# Patient Record
Sex: Male | Born: 1990 | Race: Black or African American | Hispanic: No | Marital: Single | Smoking: Current every day smoker
Health system: Southern US, Community
[De-identification: ages and names within clinical notes are randomized; demographics above are authoritative.]

---
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
## 2008-10-29 ENCOUNTER — Emergency Department: Payer: Self-pay | Admitting: Internal Medicine

## 2009-06-20 ENCOUNTER — Emergency Department: Payer: Self-pay | Admitting: Emergency Medicine

## 2009-12-23 ENCOUNTER — Emergency Department: Payer: Self-pay | Admitting: Emergency Medicine

## 2010-01-23 ENCOUNTER — Emergency Department: Payer: Self-pay | Admitting: Emergency Medicine

## 2011-04-07 ENCOUNTER — Emergency Department: Payer: Self-pay | Admitting: Emergency Medicine

## 2011-04-07 LAB — MONONUCLEOSIS SCREEN: Mono Test: NEGATIVE
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
## 2013-09-16 ENCOUNTER — Emergency Department: Payer: Self-pay | Admitting: Emergency Medicine

## 2014-01-14 IMAGING — CR DG CHEST 2V
1 series · 2 of 2 positions shown · non-contrast
Comparison: none

REASON FOR EXAM: fever cough
COMMENTS:

PROCEDURE:     DXR - DXR CHEST PA (OR AP) AND LATERAL  - April 07, 2011  [DATE]
RESULT:     The lung fields are clear. The heart, mediastinal and osseous
structures show no significant abnormalities. The chest appears mildly
hyperinflated bilaterally which suggests a history of asthma.

[Series 1: w chest pa · 0.14mm/px · 2 of 2 slices shown]
[im 1/2]
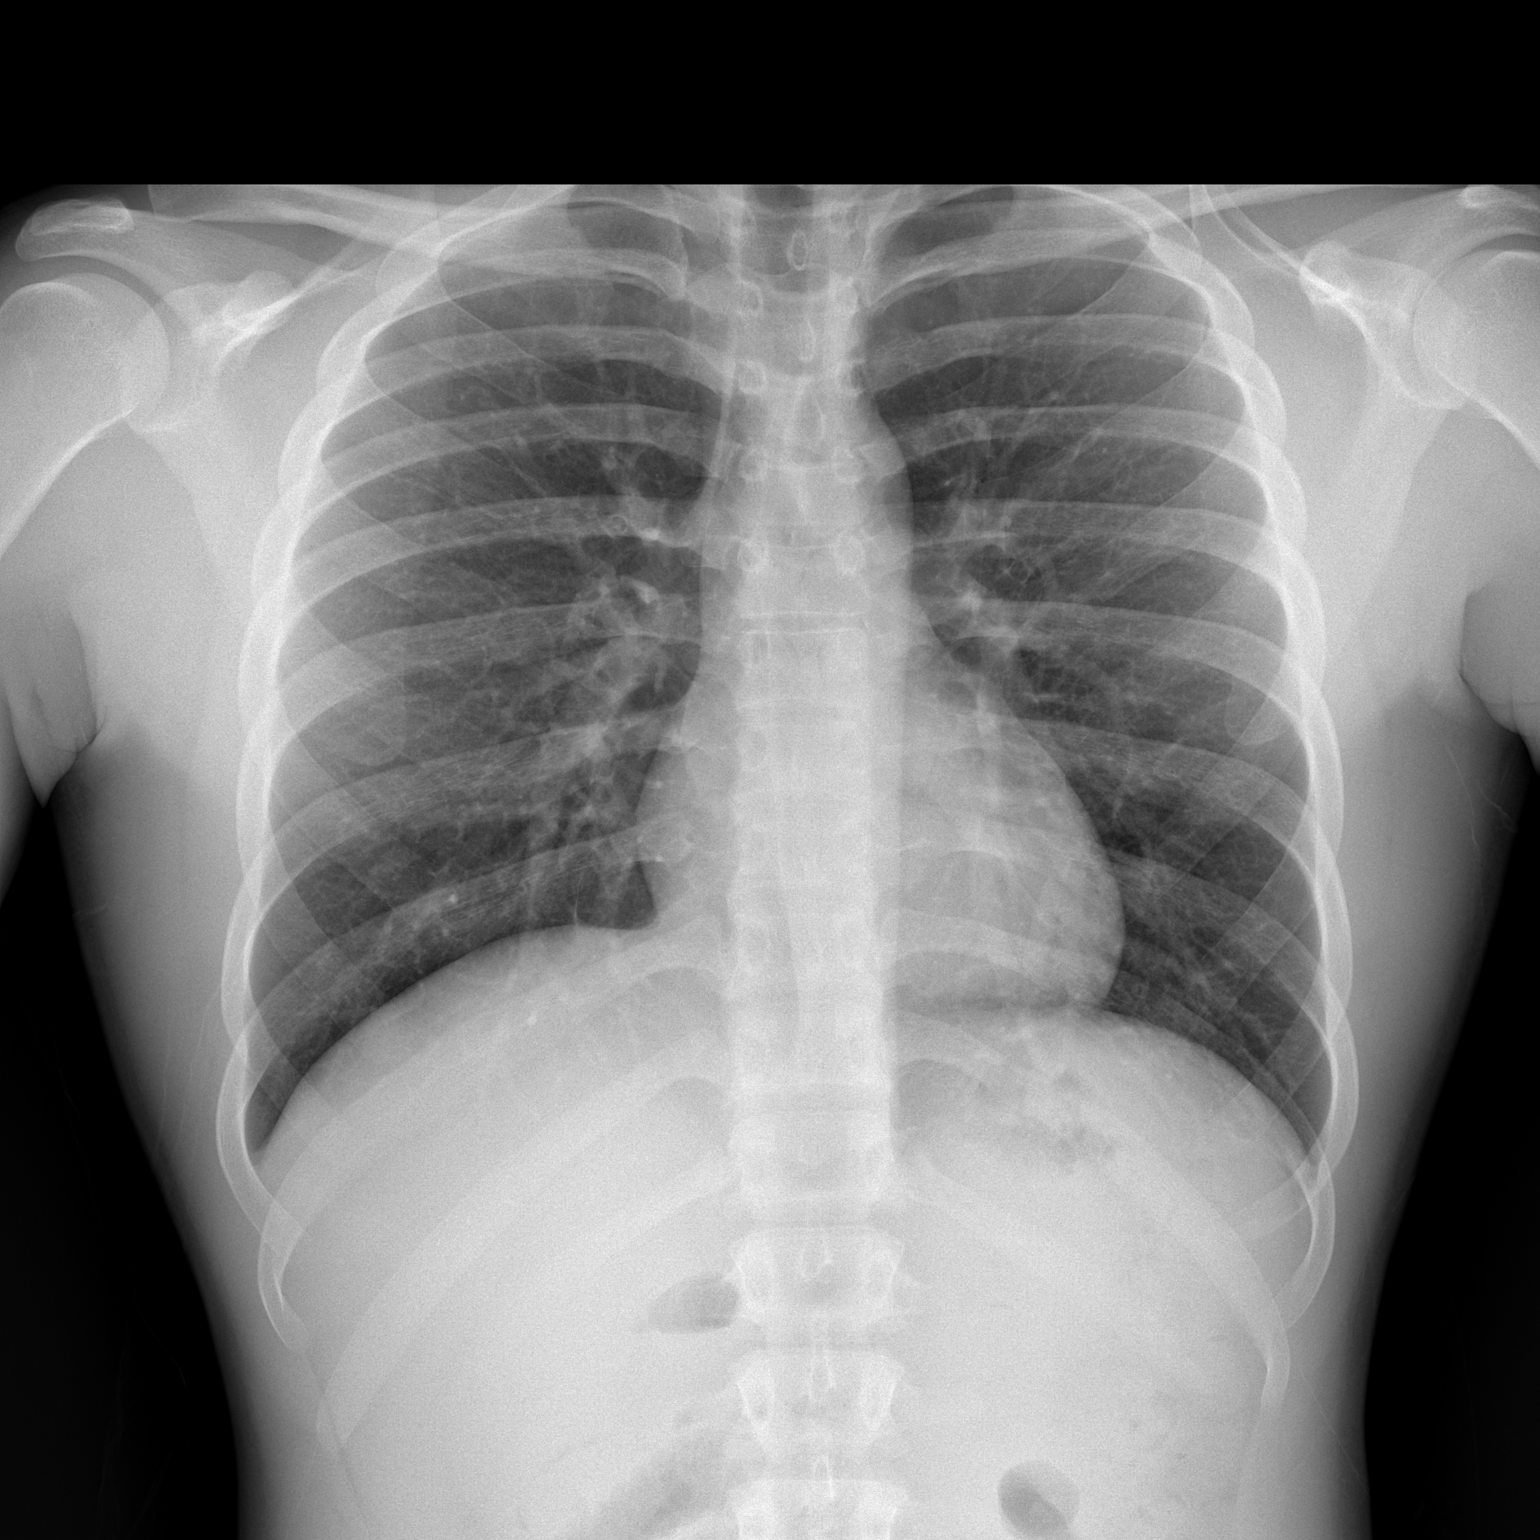
[im 2/2]
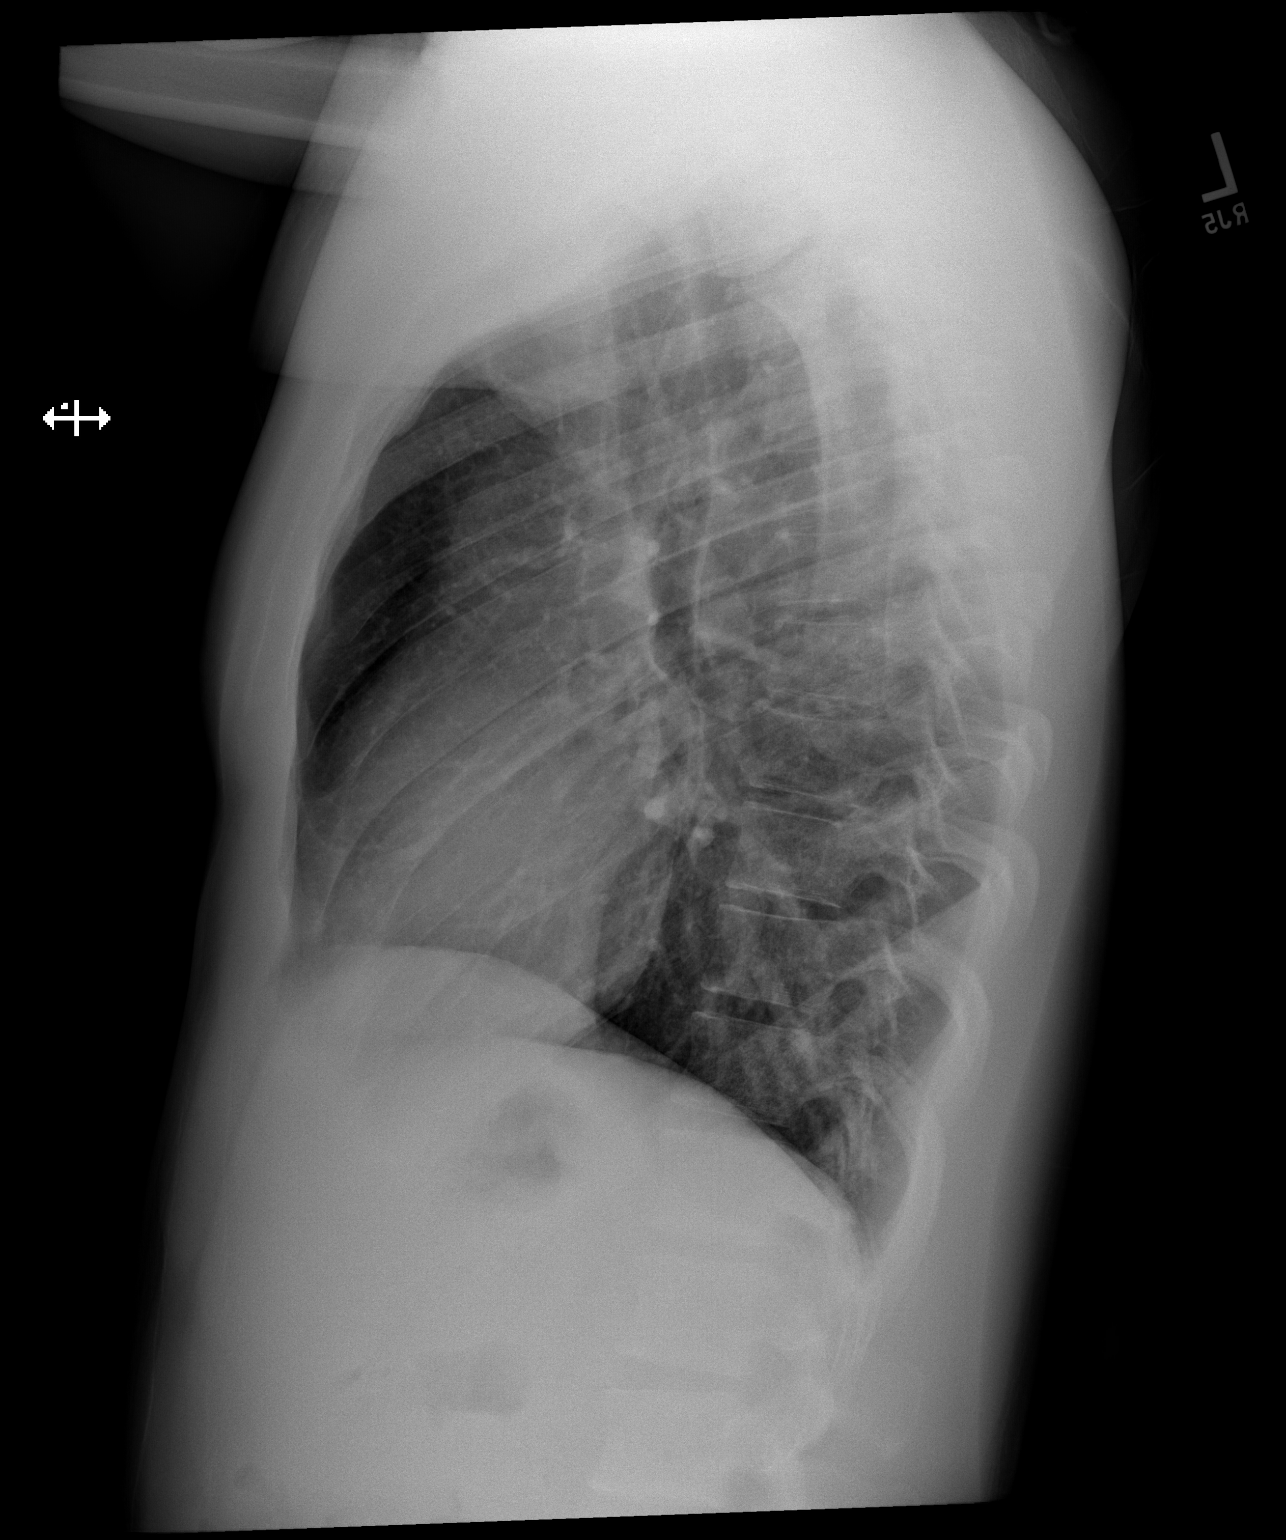

[2 of 2 positions shown; findings below may reference images not displayed]

IMPRESSION: 1. The lung fields are clear.
2. Heart size is normal.
3. The chest appears mildly hyperinflated bilaterally.

## 2014-07-26 ENCOUNTER — Encounter: Payer: Self-pay | Admitting: Urgent Care

## 2014-07-26 DIAGNOSIS — S4992XA Unspecified injury of left shoulder and upper arm, initial encounter: Secondary | ICD-10-CM | POA: Diagnosis not present

## 2014-07-26 DIAGNOSIS — Z720 Tobacco use: Secondary | ICD-10-CM | POA: Diagnosis not present

## 2014-07-26 DIAGNOSIS — Y92410 Unspecified street and highway as the place of occurrence of the external cause: Secondary | ICD-10-CM | POA: Diagnosis not present

## 2014-07-26 DIAGNOSIS — Y998 Other external cause status: Secondary | ICD-10-CM | POA: Diagnosis not present

## 2014-07-26 DIAGNOSIS — S29012A Strain of muscle and tendon of back wall of thorax, initial encounter: Secondary | ICD-10-CM | POA: Diagnosis not present

## 2014-07-26 DIAGNOSIS — Y9389 Activity, other specified: Secondary | ICD-10-CM | POA: Diagnosis not present

## 2014-07-26 DIAGNOSIS — S299XXA Unspecified injury of thorax, initial encounter: Secondary | ICD-10-CM | POA: Diagnosis present

## 2014-07-26 DIAGNOSIS — S239XXA Sprain of unspecified parts of thorax, initial encounter: Secondary | ICD-10-CM | POA: Insufficient documentation
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
## 2014-07-27 ENCOUNTER — Emergency Department
Admission: EM | Admit: 2014-07-27 | Discharge: 2014-07-27 | Disposition: A | Discharge status: Home / Self Care | Payer: No Typology Code available for payment source | Attending: Emergency Medicine | Admitting: Emergency Medicine

## 2014-07-27 ENCOUNTER — Encounter: Payer: Self-pay | Admitting: Emergency Medicine

## 2014-07-27 ENCOUNTER — Emergency Department: Payer: No Typology Code available for payment source

## 2014-07-27 DIAGNOSIS — Z720 Tobacco use: Secondary | ICD-10-CM | POA: Diagnosis not present

## 2014-07-27 DIAGNOSIS — S233XXA Sprain of ligaments of thoracic spine, initial encounter: Secondary | ICD-10-CM

## 2014-07-27 DIAGNOSIS — Z791 Long term (current) use of non-steroidal anti-inflammatories (NSAID): Secondary | ICD-10-CM | POA: Diagnosis not present

## 2014-07-27 DIAGNOSIS — S4991XD Unspecified injury of right shoulder and upper arm, subsequent encounter: Secondary | ICD-10-CM | POA: Diagnosis present

## 2014-07-27 DIAGNOSIS — S299XXD Unspecified injury of thorax, subsequent encounter: Secondary | ICD-10-CM | POA: Diagnosis not present

## 2014-07-27 DIAGNOSIS — T148 Other injury of unspecified body region: Secondary | ICD-10-CM | POA: Insufficient documentation

## 2014-07-27 DIAGNOSIS — S29012A Strain of muscle and tendon of back wall of thorax, initial encounter: Secondary | ICD-10-CM

## 2014-07-27 MED ORDER — NAPROXEN 250 MG PO TABS: 250.0000 mg | ORAL_TABLET | Freq: Two times a day (BID) | ORAL | Status: AC

## 2014-07-27 MED ORDER — DIAZEPAM 5 MG PO TABS
5.0000 mg | ORAL_TABLET | Freq: Once | ORAL | Status: AC
  Administered 2014-07-27: 5 mg via ORAL

## 2014-07-27 MED ORDER — DIAZEPAM 5 MG PO TABS: 5.0000 mg | ORAL_TABLET | Freq: Three times a day (TID) | ORAL | Status: AC | PRN

## 2014-07-27 MED ORDER — DIAZEPAM 5 MG PO TABS
ORAL_TABLET | ORAL | Status: AC
  Administered 2014-07-27: 5 mg via ORAL
  Filled 2014-07-27: qty 1

## 2014-07-27 MED ORDER — IBUPROFEN 800 MG PO TABS
800.0000 mg | ORAL_TABLET | Freq: Once | ORAL | Status: AC
  Administered 2014-07-27: 800 mg via ORAL

## 2014-07-27 MED ORDER — IBUPROFEN 800 MG PO TABS
ORAL_TABLET | ORAL | Status: AC
  Administered 2014-07-27: 800 mg via ORAL
  Filled 2014-07-27: qty 1
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
## 2014-07-27 NOTE — Discharge Instructions (Signed)
Back Exercises These exercises may help you when beginning to rehabilitate your injury. Your symptoms may resolve with or without further involvement from your physician, physical therapist or athletic trainer. While completing these exercises, remember:   Restoring tissue flexibility helps normal motion to return to the joints. This allows healthier, less painful movement and activity.  An effective stretch should be held for at least 30 seconds.  A stretch should never be painful. You should only feel a gentle lengthening or release in the stretched tissue. STRETCH - Extension, Prone on Elbows   Lie on your stomach on the floor, a bed will be too soft. Place your palms about shoulder width apart and at the height of your head.  Place your elbows under your shoulders. If this is too painful, stack pillows under your chest.  Allow your body to relax so that your hips drop lower and make contact more completely with the floor.  Hold this position for __________ seconds.  Slowly return to lying flat on the floor. Repeat __________ times. Complete this exercise __________ times per day.  RANGE OF MOTION - Extension, Prone Press Ups   Lie on your stomach on the floor, a bed will be too soft. Place your palms about shoulder width apart and at the height of your head.  Keeping your back as relaxed as possible, slowly straighten your elbows while keeping your hips on the floor. You may adjust the placement of your hands to maximize your comfort. As you gain motion, your hands will come more underneath your shoulders.  Hold this position __________ seconds.  Slowly return to lying flat on the floor. Repeat __________ times. Complete this exercise __________ times per day.  RANGE OF MOTION- Quadruped, Neutral Spine   Assume a hands and knees position on a firm surface. Keep your hands under your shoulders and your knees under your hips. You may place padding under your knees for  comfort.  Drop your head and point your tail bone toward the ground below you. This will round out your low back like an angry cat. Hold this position for __________ seconds.  Slowly lift your head and release your tail bone so that your back sags into a large arch, like an old horse.  Hold this position for __________ seconds.  Repeat this until you feel limber in your low back.  Now, find your "sweet spot." This will be the most comfortable position somewhere between the two previous positions. This is your neutral spine. Once you have found this position, tense your stomach muscles to support your low back.  Hold this position for __________ seconds. Repeat __________ times. Complete this exercise __________ times per day.  STRETCH - Flexion, Single Knee to Chest   Lie on a firm bed or floor with both legs extended in front of you.  Keeping one leg in contact with the floor, bring your opposite knee to your chest. Hold your leg in place by either grabbing behind your thigh or at your knee.  Pull until you feel a gentle stretch in your low back. Hold __________ seconds.  Slowly release your grasp and repeat the exercise with the opposite side. Repeat __________ times. Complete this exercise __________ times per day.  STRETCH - Hamstrings, Standing  Stand or sit and extend your right / left leg, placing your foot on a chair or foot stool  Keeping a slight arch in your low back and your hips straight forward.  Lead with your chest and  lean forward at the waist until you feel a gentle stretch in the back of your right / left knee or thigh. (When done correctly, this exercise requires leaning only a small distance.)  Hold this position for __________ seconds. Repeat __________ times. Complete this stretch __________ times per day. STRENGTHENING - Deep Abdominals, Pelvic Tilt   Lie on a firm bed or floor. Keeping your legs in front of you, bend your knees so they are both pointed  toward the ceiling and your feet are flat on the floor.  Tense your lower abdominal muscles to press your low back into the floor. This motion will rotate your pelvis so that your tail bone is scooping upwards rather than pointing at your feet or into the floor.  With a gentle tension and even breathing, hold this position for __________ seconds. Repeat __________ times. Complete this exercise __________ times per day.  STRENGTHENING - Abdominals, Crunches   Lie on a firm bed or floor. Keeping your legs in front of you, bend your knees so they are both pointed toward the ceiling and your feet are flat on the floor. Cross your arms over your chest.  Slightly tip your chin down without bending your neck.  Tense your abdominals and slowly lift your trunk high enough to just clear your shoulder blades. Lifting higher can put excessive stress on the low back and does not further strengthen your abdominal muscles.  Control your return to the starting position. Repeat __________ times. Complete this exercise __________ times per day.  STRENGTHENING - Quadruped, Opposite UE/LE Lift   Assume a hands and knees position on a firm surface. Keep your hands under your shoulders and your knees under your hips. You may place padding under your knees for comfort.  Find your neutral spine and gently tense your abdominal muscles so that you can maintain this position. Your shoulders and hips should form a rectangle that is parallel with the floor and is not twisted.  Keeping your trunk steady, lift your right hand no higher than your shoulder and then your left leg no higher than your hip. Make sure you are not holding your breath. Hold this position __________ seconds.  Continuing to keep your abdominal muscles tense and your back steady, slowly return to your starting position. Repeat with the opposite arm and leg. Repeat __________ times. Complete this exercise __________ times per day. Document Released:  02/14/2005 Document Revised: 04/21/2011 Document Reviewed: 05/11/2008 [facility] Patient Information 2015 [place], [place]. This information is not intended to replace advice given to you by your health care provider. Make sure you discuss any questions you have with your health care provider.  Thoracic Strain You have injured the muscles or tendons that attach to the upper part of your back behind your chest. This injury is called a thoracic strain, thoracic sprain, or mid-back strain.  CAUSES  The cause of thoracic strain varies. A less severe injury involves pulling a muscle or tendon without tearing it. A more severe injury involves tearing (rupturing) a muscle or tendon. With less severe injuries, there may be little loss of strength. Sometimes, there are breaks (fractures) in the bones to which the muscles are attached. These fractures are rare, unless there was a direct hit (trauma) or you have weak bones due to osteoporosis or age. Longstanding strains may be caused by overuse or improper form during certain movements. Obesity can also increase your risk for back injuries. Sudden strains may occur due to injury or not  warming up properly before exercise. Often, there is no obvious cause for a thoracic strain. SYMPTOMS  The main symptom is pain, especially with movement, such as during exercise. DIAGNOSIS  Your caregiver can usually tell what is wrong by taking an X-ray and doing a physical exam. TREATMENT   Physical therapy may be helpful for recovery. Your caregiver can give you exercises to do or refer you to a physical therapist after your pain improves.  After your pain improves, strengthening and conditioning programs appropriate for your sport or occupation may be helpful.  Always warm up before physical activities or athletics. Stretching after physical activity may also help.  Certain over-the-counter medicines may also help. Ask your caregiver if there are medicines that would help  you. If this is your first thoracic strain injury, proper care and proper healing time before starting activities should prevent long-term problems. Torn ligaments and tendons require as long to heal as broken bones. Average healing times may be only 1 week for a mild strain. For torn muscles and tendons, healing time may be up to 6 weeks to 2 months. HOME CARE INSTRUCTIONS   Apply ice to the injured area. Ice massages may also be used as directed.  Put ice in a plastic bag.  Place a towel between your skin and the bag.  Leave the ice on for 15-20 minutes, 03-04 times a day, for the first 2 days.  Only take over-the-counter or prescription medicines for pain, discomfort, or fever as directed by your caregiver.  Keep your appointments for physical therapy if this was prescribed.  Use wraps and back braces as instructed. SEEK IMMEDIATE MEDICAL CARE IF:   You have an increase in bruising, swelling, or pain.  Your pain has not improved with medicines.  You develop new shortness of breath, chest pain, or fever.  Problems seem to be getting worse rather than better. MAKE SURE YOU:   Understand these instructions.  Will watch your condition.  Will get help right away if you are not doing well or get worse. Document Released: 04/19/2003 Document Revised: 04/21/2011 Document Reviewed: 03/15/2010 [facility] Patient Information 2015 [place], [place]. This information is not intended to replace advice given to you by your health care provider. Make sure you discuss any questions you have with your health care provider.   You were prescribed a medication that is potentially sedating. Do not drink alcohol, drive or participate in any other potentially dangerous activities while taking this medication as it may make you sleepy. Do not take this medication with any other sedating medications, either prescription or over-the-counter. If you were prescribed Percocet or Vicodin, do not take these with  acetaminophen (Tylenol) as it is already contained within these medications.   Opioid pain medications (or "narcotics") can be habit forming.  Use it as little as possible to achieve adequate pain control.  Do not use or use it with extreme caution if you have a history of opiate abuse or dependence.  If you are on a pain contract with your primary care doctor or a pain specialist, be sure to let them know you were prescribed this medication today from the [facility].  This medication is intended for your use only - do not give any to anyone else and keep it in a secure place where nobody else, especially children and pets, have access to it.  It will also cause or worsen constipation, so you may want to consider taking an over-the-counter stool softener  while you are taking this medication.
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
## 2014-07-28 ENCOUNTER — Emergency Department
Admission: EM | Admit: 2014-07-28 | Discharge: 2014-07-28 | Disposition: A | Discharge status: Home / Self Care | Payer: No Typology Code available for payment source | Attending: Emergency Medicine | Admitting: Emergency Medicine

## 2014-07-28 DIAGNOSIS — T148XXA Other injury of unspecified body region, initial encounter: Secondary | ICD-10-CM

## 2014-07-28 NOTE — ED Notes (Signed)

## 2014-07-28 NOTE — ED Notes (Signed)
Patient states is taking the medications prescribed from his visit yesterday.

## 2014-07-28 NOTE — Discharge Instructions (Signed)
Contusion °A contusion is a deep bruise. Contusions happen when an injury causes bleeding under the skin. Signs of bruising include pain, puffiness (swelling), and discolored skin. The contusion may turn blue, purple, or yellow. °HOME CARE  °· Put ice on the injured area. °¨ Put ice in a plastic bag. °¨ Place a towel between your skin and the bag. °¨ Leave the ice on for 15-20 minutes, 03-04 times a day. °· Only take medicine as told by your doctor. °· Rest the injured area. °· If possible, raise (elevate) the injured area to lessen puffiness. °GET HELP RIGHT AWAY IF:  °· You have more bruising or puffiness. °· You have pain that is getting worse. °· Your puffiness or pain is not helped by medicine. °MAKE SURE YOU:  °· Understand these instructions. °· Will watch your condition. °· Will get help right away if you are not doing well or get worse. °Document Released: 07/16/2007 Document Revised: 04/21/2011 Document Reviewed: 12/02/2010 °[employer]® Patient Information ©2015 [employer]. This information is not intended to replace advice given to you by your health care provider. Make sure you discuss any questions you have with your health care provider. ° °
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
## 2014-08-02 ENCOUNTER — Emergency Department
Admission: EM | Admit: 2014-08-02 | Discharge: 2014-08-02 | Disposition: A | Discharge status: Home / Self Care | Payer: No Typology Code available for payment source | Attending: Emergency Medicine | Admitting: Emergency Medicine

## 2014-08-02 ENCOUNTER — Encounter: Payer: Self-pay | Admitting: Urgent Care

## 2014-08-02 DIAGNOSIS — Y998 Other external cause status: Secondary | ICD-10-CM | POA: Insufficient documentation

## 2014-08-02 DIAGNOSIS — T148XXA Other injury of unspecified body region, initial encounter: Secondary | ICD-10-CM

## 2014-08-02 DIAGNOSIS — Z021 Encounter for pre-employment examination: Secondary | ICD-10-CM | POA: Diagnosis present

## 2014-08-02 DIAGNOSIS — T148 Other injury of unspecified body region: Secondary | ICD-10-CM | POA: Diagnosis not present

## 2014-08-02 DIAGNOSIS — Z720 Tobacco use: Secondary | ICD-10-CM | POA: Insufficient documentation

## 2014-08-02 DIAGNOSIS — Y9389 Activity, other specified: Secondary | ICD-10-CM | POA: Insufficient documentation

## 2014-08-02 DIAGNOSIS — S4991XA Unspecified injury of right shoulder and upper arm, initial encounter: Secondary | ICD-10-CM | POA: Insufficient documentation

## 2014-08-02 DIAGNOSIS — Y92410 Unspecified street and highway as the place of occurrence of the external cause: Secondary | ICD-10-CM | POA: Diagnosis not present

## 2014-08-02 DIAGNOSIS — M62838 Other muscle spasm: Secondary | ICD-10-CM

## 2014-08-02 NOTE — ED Notes (Signed)
Pt was released to work with no restrictions per Dr Pershing Proud

## 2014-08-02 NOTE — ED Notes (Signed)
ED provider at bedside evaluating patient
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
## 2014-08-02 NOTE — Discharge Instructions (Signed)
Contusion °A contusion is the result of an injury to the skin and underlying tissues and is usually caused by direct trauma. The injury results in the appearance of a bruise on the skin overlying the injured tissues. Contusions cause rupture and bleeding of the small capillaries and blood vessels and affect function, because the bleeding infiltrates muscles, tendons, nerves, or other soft tissues.  °SYMPTOMS  °· Swelling and often a hard lump in the injured area, either superficial or deep. °· Pain and tenderness over the area of the contusion. °· Feeling of firmness when pressure is exerted over the contusion. °· Discoloration under the skin, beginning with redness and progressing to the characteristic "black and blue" bruise. °CAUSES  °A contusion is typically the result of direct trauma. This is often by a blunt object.  °RISK INCREASES WITH: °· Sports that have a high likelihood of trauma (football, boxing, ice hockey, soccer, field hockey, martial arts, basketball, and baseball). °· Sports that make falling from a height likely (high-jumping, pole-vaulting, skating, or gymnastics). °· Any bleeding disorder (hemophilia) or taking medications that affect clotting (aspirin, nonsteroidal anti-inflammatory medications, or warfarin [Coumadin]). °· Inadequate protection of exposed areas during contact sports. °PREVENTION °· Maintain physical fitness: °¨ Joint and muscle flexibility. °¨ Strength and endurance. °¨ Coordination. °· Wear proper protective equipment. Make sure it fits correctly. °PROGNOSIS  °Contusions typically heal without any complications. Healing time varies with the severity of injury and intake of medications that affect clotting. Contusions usually heal in 1 to 4 weeks. °RELATED COMPLICATIONS  °· Damage to nearby nerves or blood vessels, causing numbness, coldness, or paleness. °· Compartment syndrome. °· Bleeding into the soft tissues that leads to disability. °· Infiltrative-type bleeding,  leading to the calcification and impaired function of the injured muscle (rare). °· Prolonged healing time if usual activities are resumed too soon. °· Infection if the skin over the injury site is broken. °· Fracture of the bone underlying the contusion. °· Stiffness in the joint where the injured muscle crosses. °TREATMENT  °Treatment initially consists of resting the injured area as well as medication and ice to reduce inflammation. The use of a compression bandage may also be helpful in minimizing inflammation. As pain diminishes and movement is tolerated, the joint where the affected muscle crosses should be moved to prevent stiffness and the shortening (contracture) of the joint. Movement of the joint should begin as soon as possible. It is also important to work on maintaining strength within the affected muscles. °Occasionally, extra padding over the area of contusion may be recommended before returning to sports, particularly if re-injury is likely.  °MEDICATION  °· If pain relief is necessary these medications are often recommended: °¨ Nonsteroidal anti-inflammatory medications, such as aspirin and ibuprofen. °¨ Other minor pain relievers, such as acetaminophen, are often recommended. °· Prescription pain relievers may be given by your caregiver. Use only as directed and only as much as you need. °HEAT AND COLD °· Cold treatment (icing) relieves pain and reduces inflammation. Cold treatment should be applied for 10 to 15 minutes every 2 to 3 hours for inflammation and pain and immediately after any activity that aggravates your symptoms. Use ice packs or an ice massage. (To do an ice massage fill a large styrofoam cup with water and freeze. Tear a small amount of foam from the top so ice protrudes. Massage ice firmly over the injured area in a circle about the size of a softball.) °· Heat treatment may be used prior to   performing the stretching and strengthening activities prescribed by your caregiver,  physical therapist, or athletic trainer. Use a heat pack or a warm soak. °SEEK MEDICAL CARE IF:  °· Symptoms get worse or do not improve despite treatment in a few days. °· You have difficulty moving a joint. °· Any extremity becomes extremely painful, numb, pale, or cool (This is an emergency!). °· Medication produces any side effects (bleeding, upset stomach, or allergic reaction). °· Signs of infection (drainage from skin, headache, muscle aches, dizziness, fever, or general ill feeling) occur if skin was broken. °Document Released: 01/27/2005 Document Revised: 04/21/2011 Document Reviewed: 05/11/2008 °[employer]® Patient Information ©2015 [employer]. This information is not intended to replace advice given to you by your health care provider. Make sure you discuss any questions you have with your health care provider. ° °

## 2014-08-18 ENCOUNTER — Emergency Department
Admission: EM | Admit: 2014-08-18 | Discharge: 2014-08-18 | Disposition: A | Discharge status: Home / Self Care | Payer: Self-pay | Attending: Emergency Medicine | Admitting: Emergency Medicine

## 2014-08-18 DIAGNOSIS — Z87828 Personal history of other (healed) physical injury and trauma: Secondary | ICD-10-CM | POA: Insufficient documentation

## 2014-08-18 DIAGNOSIS — M545 Low back pain: Secondary | ICD-10-CM | POA: Insufficient documentation

## 2014-08-18 DIAGNOSIS — M25511 Pain in right shoulder: Secondary | ICD-10-CM | POA: Insufficient documentation

## 2014-08-18 DIAGNOSIS — Z720 Tobacco use: Secondary | ICD-10-CM | POA: Insufficient documentation

## 2014-08-18 DIAGNOSIS — M549 Dorsalgia, unspecified: Secondary | ICD-10-CM

## 2014-08-18 NOTE — ED Notes (Signed)
Pt also reports right shoulder pain (6/10).
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
## 2014-08-18 NOTE — Discharge Instructions (Signed)
Back Pain, Adult Low back pain is very common. About 1 in 5 people have back pain.The cause of low back pain is rarely dangerous. The pain often gets better over time.About half of people with a sudden onset of back pain feel better in just 2 weeks. About 8 in 10 people feel better by 6 weeks.  CAUSES Some common causes of back pain include:  Strain of the muscles or ligaments supporting the spine.  Wear and tear (degeneration) of the spinal discs.  Arthritis.  Direct injury to the back. DIAGNOSIS Most of the time, the direct cause of low back pain is not known.However, back pain can be treated effectively even when the exact cause of the pain is unknown.Answering your caregiver's questions about your overall health and symptoms is one of the most accurate ways to make sure the cause of your pain is not dangerous. If your caregiver needs more information, he or she may order lab work or imaging tests (X-rays or MRIs).However, even if imaging tests show changes in your back, this usually does not require surgery. HOME CARE INSTRUCTIONS For many people, back pain returns.Since low back pain is rarely dangerous, it is often a condition that people can learn to manageon their own.   Remain active. It is stressful on the back to sit or stand in one place. Do not sit, drive, or stand in one place for more than 30 minutes at a time. Take short walks on level surfaces as soon as pain allows.Try to increase the length of time you walk each day.  Do not stay in bed.Resting more than 1 or 2 days can delay your recovery.  Do not avoid exercise or work.Your body is made to move.It is not dangerous to be active, even though your back may hurt.Your back will likely heal faster if you return to being active before your pain is gone.  Pay attention to your body when you bend and lift. Many people have less discomfortwhen lifting if they bend their knees, keep the load close to their bodies,and  avoid twisting. Often, the most comfortable positions are those that put less stress on your recovering back.  Find a comfortable position to sleep. Use a firm mattress and lie on your side with your knees slightly bent. If you lie on your back, put a pillow under your knees.  Only take over-the-counter or prescription medicines as directed by your caregiver. Over-the-counter medicines to reduce pain and inflammation are often the most helpful.Your caregiver may prescribe muscle relaxant drugs.These medicines help dull your pain so you can more quickly return to your normal activities and healthy exercise.  Put ice on the injured area.  Put ice in a plastic bag.  Place a towel between your skin and the bag.  Leave the ice on for 15-20 minutes, 03-04 times a day for the first 2 to 3 days. After that, ice and heat may be alternated to reduce pain and spasms.  Ask your caregiver about trying back exercises and gentle massage. This may be of some benefit.  Avoid feeling anxious or stressed.Stress increases muscle tension and can worsen back pain.It is important to recognize when you are anxious or stressed and learn ways to manage it.Exercise is a great option. SEEK MEDICAL CARE IF:  You have pain that is not relieved with rest or medicine.  You have pain that does not improve in 1 week.  You have new symptoms.  You are generally not feeling well. SEEK   IMMEDIATE MEDICAL CARE IF:   You have pain that radiates from your back into your legs.  You develop new bowel or bladder control problems.  You have unusual weakness or numbness in your arms or legs.  You develop nausea or vomiting.  You develop abdominal pain.  You feel faint. Document Released: 01/27/2005 Document Revised: 07/29/2011 Document Reviewed: 05/31/2013 [employer] Patient Information 2015 [employer]. This information is not intended to replace advice given to you by your health care provider. Make sure you  discuss any questions you have with your health care provider.  
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
## 2018-03-08 ENCOUNTER — Other Ambulatory Visit: Payer: Self-pay

## 2018-03-08 ENCOUNTER — Encounter: Payer: Self-pay | Admitting: Emergency Medicine

## 2018-03-08 ENCOUNTER — Emergency Department
Admission: EM | Admit: 2018-03-08 | Discharge: 2018-03-08 | Disposition: A | Discharge status: Home / Self Care | Payer: BLUE CROSS/BLUE SHIELD | Attending: Emergency Medicine | Admitting: Emergency Medicine

## 2018-03-08 DIAGNOSIS — H5789 Other specified disorders of eye and adnexa: Secondary | ICD-10-CM | POA: Diagnosis present

## 2018-03-08 DIAGNOSIS — F17210 Nicotine dependence, cigarettes, uncomplicated: Secondary | ICD-10-CM | POA: Insufficient documentation

## 2018-03-08 DIAGNOSIS — H1032 Unspecified acute conjunctivitis, left eye: Secondary | ICD-10-CM | POA: Insufficient documentation

## 2018-03-08 MED ORDER — POLYMYXIN B-TRIMETHOPRIM 10000-0.1 UNIT/ML-% OP SOLN: 1.0000 [drp] | Freq: Four times a day (QID) | OPHTHALMIC | 0 refills | Status: AC

## 2018-03-08 NOTE — ED Triage Notes (Signed)
Patient ambulatory to triage with steady gait, without difficulty or distress noted; pt reports since this am having left eye redness; denies pain or itching, no discharge

## 2018-03-08 NOTE — ED Notes (Signed)
Pt states that his left eye is irritated since today. Eye is red, watery and puffy.

## 2018-03-08 NOTE — ED Provider Notes (Signed)
[facility] Emergency Department Provider Note  ____________________________________________  Time seen: Approximately 11:26 PM  I have reviewed the triage vital signs and the nursing notes.   HISTORY  Chief Complaint Eye Problem    HPI Charles Larson is a 28 y.o. male presents to the emergency department with left eye conjunctivitis that became apparent today.  No changes in vision.  Patient has noticed increased tearing and crusting.  No pain with eye movement.  No alleviating measures have been attempted.   History reviewed. No pertinent past medical history.  There are no active problems to display for this patient.   History reviewed. No pertinent surgical history.  Prior to Admission medications   Medication Sig Start Date End Date Taking? Authorizing Provider  diazepam (VALIUM) 5 MG tablet Take 1 tablet (5 mg total) by mouth every 8 (eight) hours as needed for muscle spasms. 07/27/14   Sharman Cheek, MD  naproxen (NAPROSYN) 250 MG tablet Take 1 tablet (250 mg total) by mouth 2 (two) times daily with a meal. 07/27/14   Sharman Cheek, MD  trimethoprim-polymyxin b (POLYTRIM) ophthalmic solution Place 1 drop into the left eye every 6 (six) hours for 7 days. 03/08/18 03/15/18  Orvil Feil, PA-C    Allergies Patient has no known allergies.  No family history on file.  Social History Social History   Tobacco Use  . Smoking status: Current Every Day Smoker    Packs/day: 0.50    Types: Cigarettes  . Smokeless tobacco: Current User  Substance Use Topics  . Alcohol use: No  . Drug use: Not on file     Review of Systems  Constitutional: No fever/chills Eyes: Patient has left eye conjunctivitis.  No discharge ENT: No upper respiratory complaints. Cardiovascular: no chest pain. Respiratory: no cough. No SOB. Gastrointestinal: No abdominal pain.  No nausea, no vomiting.  No diarrhea.  No constipation. Genitourinary: Negative for  dysuria. No hematuria Musculoskeletal: Negative for musculoskeletal pain. Skin: Negative for rash, abrasions, lacerations, ecchymosis. Neurological: Negative for headaches, focal weakness or numbness.   ____________________________________________   PHYSICAL EXAM:  VITAL SIGNS: ED Triage Vitals  Enc Vitals Group     BP 03/08/18 2233 (!) 150/82     Pulse Rate 03/08/18 2233 84     Resp 03/08/18 2233 18     Temp 03/08/18 2233 98 F (36.7 C)     Temp Source 03/08/18 2233 Oral     SpO2 03/08/18 2233 99 %     Weight 03/08/18 2231 210 lb (95.3 kg)     Height 03/08/18 2231 5\' 5"  (1.651 m)     Head Circumference --      Peak Flow --      Pain Score 03/08/18 2230 0     Pain Loc --      Pain Edu? --      Excl. in GC? --      Constitutional: Alert and oriented. Well appearing and in no acute distress. Eyes: Left eye conjunctivitis visualized.  PERRL. EOMI. Head: Atraumatic. Cardiovascular: Normal rate, regular rhythm. Normal S1 and S2.  Good peripheral circulation. Respiratory: Normal respiratory effort without tachypnea or retractions. Lungs CTAB. Good air entry to the bases with no decreased or absent breath sounds. Skin:  Skin is warm, dry and intact. No rash noted. Psychiatric: Mood and affect are normal. Speech and behavior are normal. Patient exhibits appropriate insight and judgement.   ____________________________________________   LABS (all labs ordered are listed, but only abnormal  results are displayed)  Labs Reviewed - No data to display ____________________________________________  EKG   ____________________________________________  RADIOLOGY   No results found.  ____________________________________________    PROCEDURES  Procedure(s) performed:    Procedures    Medications - No data to display   ____________________________________________   INITIAL IMPRESSION / ASSESSMENT AND PLAN / ED COURSE  Pertinent labs & imaging results that  were available during my care of the patient were reviewed by me and considered in my medical decision making (see chart for details).  Review of the [place] CSRS was performed in accordance of the [facility] prior to dispensing any controlled drugs.      Assessment and plan Left eye conjunctivitis Patient presents to the emergency department with left eye conjunctivitis.  He was discharged with Polytrim.  Patient was advised to follow-up with primary care as needed.  All patient questions were answered.     ____________________________________________  FINAL CLINICAL IMPRESSION(S) / ED DIAGNOSES  Final diagnoses:  Acute bacterial conjunctivitis of left eye      NEW MEDICATIONS STARTED DURING THIS VISIT:  ED Discharge Orders         Ordered    trimethoprim-polymyxin b (POLYTRIM) ophthalmic solution  Every 6 hours     03/08/18 2324              This chart was dictated using voice recognition software/Dragon. Despite best efforts to proofread, errors can occur which can change the meaning. Any change was purely unintentional.    Gasper Lloyd 03/08/18 2327    Rockne Menghini, MD 03/08/18 [phone]
